# Patient Record
Sex: Male | Born: 2005 | Race: Black or African American | Hispanic: No | Marital: Single | Smoking: Never smoker
Health system: Southern US, Community
[De-identification: ages and names within clinical notes are randomized; demographics above are authoritative.]

---
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
## 2023-05-15 ENCOUNTER — Emergency Department
Admission: EM | Admit: 2023-05-15 | Discharge: 2023-05-15 | Disposition: A | Discharge status: Home / Self Care | Payer: Medicaid Other | Attending: Student in an Organized Health Care Education/Training Program | Admitting: Student in an Organized Health Care Education/Training Program

## 2023-05-15 DIAGNOSIS — T07XXXA Unspecified multiple injuries, initial encounter: Secondary | ICD-10-CM

## 2023-05-15 DIAGNOSIS — S60511A Abrasion of right hand, initial encounter: Secondary | ICD-10-CM | POA: Insufficient documentation

## 2023-05-15 DIAGNOSIS — W010XXA Fall on same level from slipping, tripping and stumbling without subsequent striking against object, initial encounter: Secondary | ICD-10-CM | POA: Insufficient documentation

## 2023-05-15 DIAGNOSIS — S80811A Abrasion, right lower leg, initial encounter: Secondary | ICD-10-CM | POA: Diagnosis not present

## 2023-05-15 DIAGNOSIS — S6991XA Unspecified injury of right wrist, hand and finger(s), initial encounter: Secondary | ICD-10-CM | POA: Diagnosis present

## 2023-05-15 DIAGNOSIS — Y9302 Activity, running: Secondary | ICD-10-CM | POA: Insufficient documentation

## 2023-05-15 MED ORDER — LIDOCAINE-EPINEPHRINE-TETRACAINE (LET) SOLUTION
3.0000 mL | Freq: Once | NASAL | Status: AC
  Administered 2023-05-15: 3 mL via TOPICAL
  Filled 2023-05-15: qty 3

## 2023-05-15 NOTE — ED Provider Notes (Signed)
[facility] Emergency Department Provider Note     None    (approximate)   History   Fall   HPI  Reginald Garcia. is a 17 y.o. male presents to the ED because he tripped and fell.  Patient was running last night when he tripped and slipped, landing on his right leg and right knee.  He presents with abrasions to both.  No reports of any head injury or LOC.  No disability reported.  Physical Exam   Triage Vital Signs: ED Triage Vitals [05/15/23 1053]  Enc Vitals Group     BP 134/62     Pulse Rate 86     Resp 17     Temp 98.8 F (37.1 C)     Temp Source Oral     SpO2 100 %     Weight 150 lb (68 kg)     Height 5\' 5"  (1.651 m)     Head Circumference      Peak Flow      Pain Score 0     Pain Loc      Pain Edu?      Excl. in GC?     Most recent vital signs: Vitals:   05/15/23 1053  BP: 134/62  Pulse: 86  Resp: 17  Temp: 98.8 F (37.1 C)  SpO2: 100%    General Awake, no distress. NAD CV:  Good peripheral perfusion.  RESP:  Normal effort.  ABD:  No distention.  MSK:  Normal composite fist to the right hand.  Normal range of motion to all extremities. SKIN:  Right palm with a 4 cm circular abrasion with dried epidermal skin noted.  The lateral right lower extremity with a large area of abrasion measuring proximately 10 cm x 4 cm.  No warmth, induration, purulent drainage noted.   ED Results / Procedures / Treatments   Labs (all labs ordered are listed, but only abnormal results are displayed) Labs Reviewed - No data to display   EKG   RADIOLOGY  No results found.   PROCEDURES:  Critical Care performed: No  Procedures   MEDICATIONS ORDERED IN ED: Medications  lidocaine-EPINEPHrine-tetracaine (LET) solution (3 mLs Topical Given 05/15/23 1125)     IMPRESSION / MDM / ASSESSMENT AND PLAN / ED COURSE  I reviewed the triage vital signs and the nursing notes.                              Differential diagnosis  includes, but is not limited to, abrasions, lacerations, hematoma, contusions, hand sprain  Patient's presentation is most consistent with acute, uncomplicated illness.  Patient's diagnosis is consistent with abrasions to the right palm and the lateral right leg.  No evidence of any bony injury.  No signs of any cellulitis.  Wounds were cleansed thoroughly, and dressed with petroleum gauze.  Patient will be discharged home with wound care instructions and supplies. Patient is to follow up with his primary provider or local urgent care as needed or otherwise directed. Patient is given ED precautions to return to the ED for any worsening or new symptoms.     FINAL CLINICAL IMPRESSION(S) / ED DIAGNOSES   Final diagnoses:  Multiple abrasions     Rx / DC Orders   ED Discharge Orders     None        Note:  This document was prepared using Dragon voice recognition  software and may include unintentional dictation errors.    Lissa Hoard, PA-C 05/15/23 1853    Corena Herter, MD 05/26/23 [phone]

## 2023-05-15 NOTE — ED Triage Notes (Signed)
Pt sts that he fell running. Pt has an abrasion to the right leg and right hand.

## 2023-05-15 NOTE — ED Notes (Signed)
LET applied to pt's road rash on right leg with cotton swabs and large tagaderm.

## 2023-05-15 NOTE — Discharge Instructions (Signed)
Keep the wounds clean with soap and water and cover with antibiotic ointment.  Change the dressings daily.  Take OTC Tylenol or Motrin as needed for pain.
